# Patient Record
Sex: Male | Born: 1990 | Race: White | Hispanic: No | Marital: Married | Smoking: Former smoker
Health system: Southern US, Community
[De-identification: ages and names within clinical notes are randomized; demographics above are authoritative.]

## PROBLEM LIST (undated history)

## (undated) DIAGNOSIS — B019 Varicella without complication: Secondary | ICD-10-CM

## (undated) HISTORY — DX: Varicella without complication: B01.9

---

## 2013-03-07 ENCOUNTER — Emergency Department: Payer: Self-pay | Admitting: Emergency Medicine

## 2013-08-01 ENCOUNTER — Encounter (HOSPITAL_COMMUNITY): Payer: Self-pay | Admitting: Emergency Medicine

## 2013-08-01 ENCOUNTER — Emergency Department (HOSPITAL_COMMUNITY)
Admission: EM | Admit: 2013-08-01 | Discharge: 2013-08-01 | Disposition: A | Discharge status: Home / Self Care | Payer: Worker's Compensation | Attending: Emergency Medicine | Admitting: Emergency Medicine

## 2013-08-01 ENCOUNTER — Emergency Department (HOSPITAL_COMMUNITY): Payer: Worker's Compensation

## 2013-08-01 DIAGNOSIS — S6710XA Crushing injury of unspecified finger(s), initial encounter: Secondary | ICD-10-CM | POA: Insufficient documentation

## 2013-08-01 DIAGNOSIS — Y9389 Activity, other specified: Secondary | ICD-10-CM | POA: Insufficient documentation

## 2013-08-01 DIAGNOSIS — Y9289 Other specified places as the place of occurrence of the external cause: Secondary | ICD-10-CM | POA: Insufficient documentation

## 2013-08-01 DIAGNOSIS — Z23 Encounter for immunization: Secondary | ICD-10-CM | POA: Insufficient documentation

## 2013-08-01 DIAGNOSIS — IMO0002 Reserved for concepts with insufficient information to code with codable children: Secondary | ICD-10-CM | POA: Insufficient documentation

## 2013-08-01 DIAGNOSIS — Z87891 Personal history of nicotine dependence: Secondary | ICD-10-CM | POA: Insufficient documentation

## 2013-08-01 DIAGNOSIS — S67193A Crushing injury of left middle finger, initial encounter: Secondary | ICD-10-CM

## 2013-08-01 DIAGNOSIS — Y990 Civilian activity done for income or pay: Secondary | ICD-10-CM | POA: Insufficient documentation

## 2013-08-01 DIAGNOSIS — S6000XA Contusion of unspecified finger without damage to nail, initial encounter: Secondary | ICD-10-CM | POA: Insufficient documentation

## 2013-08-01 DIAGNOSIS — S60132A Contusion of left middle finger with damage to nail, initial encounter: Secondary | ICD-10-CM

## 2013-08-01 MED ORDER — IBUPROFEN 800 MG PO TABS: 800.0000 mg | ORAL_TABLET | Freq: Three times a day (TID) | ORAL | Status: DC

## 2013-08-01 MED ORDER — TETANUS-DIPHTH-ACELL PERTUSSIS 5-2.5-18.5 LF-MCG/0.5 IM SUSP
0.5000 mL | Freq: Once | INTRAMUSCULAR | Status: AC
  Administered 2013-08-01: 0.5 mL via INTRAMUSCULAR
  Filled 2013-08-01: qty 0.5

## 2013-08-01 MED ORDER — HYDROCODONE-ACETAMINOPHEN 5-325 MG PO TABS: 1.0000 | ORAL_TABLET | Freq: Four times a day (QID) | ORAL | Status: DC | PRN

## 2013-08-01 NOTE — ED Notes (Signed)
Placed pt finger in water and 3% hydrogen peroxide solution sprayed with dermal wound cleanser. Gave pt ice bag for finger.

## 2013-08-01 NOTE — ED Notes (Signed)
Pt. 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

## 2013-08-01 NOTE — ED Provider Notes (Signed)
CSN: [id number]     Arrival date & time 08/01/13  2028 History  This chart was scribed for non-physician practitioner Jaynie Crumble, PA-C, working with Gerhard Munch, MD by Dorothey Baseman, ED Scribe. This patient was seen in room TR07C/TR07C and the patient's care was started at 8:40 PM.    Chief Complaint  Patient presents with  . Finger Injury   The history is provided by the patient. No language interpreter was used.   HPI Comments: Jack Phillips is a 22 y.o. male who presents to the Emergency Department complaining of an injury to the left middle finger that occurred PTA when he states that he accidentally hit the finger with a sledge hammer while at work. Patient reports a small laceration to the tip of the middle finger. He reports associated pain and swelling to the area. Patient reports that he does not remember when his last tetanus vaccination was. Patient has no other pertinent medical history.   No past medical history on file. No past surgical history on file. No family history on file. History  Substance Use Topics  . Smoking status: Not on file  . Smokeless tobacco: Not on file  . Alcohol Use: Not on file    Review of Systems  Musculoskeletal: Positive for arthralgias, joint swelling and myalgias.  Skin: Positive for wound ( laceration).  All other systems reviewed and are negative.    Allergies  Review of patient's allergies indicates not on file.  Home Medications  No current outpatient prescriptions on file.  Triage Vitals: BP 139/80  Pulse 76  Temp(Src) 98.3 F (36.8 C) (Oral)  Resp 16  SpO2 99%  Physical Exam  Nursing note and vitals reviewed. Constitutional: He is oriented to person, place, and time. He appears well-developed and well-nourished. No distress.  HENT:  Head: Normocephalic and atraumatic.  Eyes: Conjunctivae are normal.  Neck: Normal range of motion. Neck supple.  Pulmonary/Chest: Effort normal. No respiratory distress.   Abdominal: He exhibits no distension.  Musculoskeletal: Normal range of motion.  Neurological: He is alert and oriented to person, place, and time.  Skin: Skin is warm and dry.  Swelling to the distal left middle finger with subungual hematoma. Small laceration, less than 1 cm, to the tip of the finger that is tender to palpation. Cap refill <2 sec. Normal sensation distally  Psychiatric: He has a normal mood and affect. His behavior is normal.    ED Course  Procedures (including critical care time)  DIAGNOSTIC STUDIES: Oxygen Saturation is 99% on room air, normal by my interpretation.    COORDINATION OF CARE: 8:40 PM- Will order an x-ray of the finger. Will order a tetanus vaccination. Discussed treatment plan with patient at bedside and patient verbalized agreement.   9:26 PM- Discussed negative x-ray results. Discussed that the laceration will not need to be repaired with sutures. Advised patient to keep the area clean and elevated and to apply topical antibiotics to prevent infection. Discussed treatment plan with patient at bedside and patient verbalized agreement.    Labs Review Labs Reviewed - No data to display  Imaging Review Dg Finger Middle Left  08/01/2013   CLINICAL DATA:  Left middle finger hit with a sledge hammer.  EXAM: LEFT MIDDLE FINGER 2+V  COMPARISON:  None.  FINDINGS: No fracture. The joints are normally space and aligned. There is a small soft tissue defect over the finger tip. No radiopaque foreign body.  IMPRESSION: No fracture or dislocation or radiopaque foreign body.  Electronically Signed   By: Amie Portland M.D.   On: 08/01/2013 21:20    EKG Interpretation   None       MDM   1. Crushing injury of left middle finger, initial encounter   2. Subungual hematoma of third finger of left hand, initial encounter     Patient with left middle finger injury after hitting it with a sledge hammer. Injuries just to the distal phalanx. Nail is intact however  there is a subungual hematoma for which he himself drilled a hole. There is mild bleeding from the hole. There is a very small less than 1 cm in length laceration to the tip of the finger. The finger is dirty. He cleaned it with a surgical scrub and a sponge here in ED. I did not repair the laceration giving it is a dirty wound. Dressing applied. Instructed to apply Triple Antibiotic ointment. Keep it clean and covered. Followup as needed. Instructed to keep it elevated as well ice it reduced swelling.   I personally performed the services described in this documentation, which was scribed in my presence. The recorded information has been reviewed and is accurate.     Lottie Mussel, PA-C 08/01/13 2350

## 2013-08-02 NOTE — ED Provider Notes (Signed)
  Medical screening examination/treatment/procedure(s) were performed by non-physician practitioner and as supervising physician I was immediately available for consultation/collaboration.  EKG Interpretation   None          Penney Domanski, MD 08/02/13 0000 

## 2014-11-02 IMAGING — CR DG FINGER MIDDLE 2+V*L*
3 series · 3 of 3 positions shown · non-contrast
Comparison: None.

CLINICAL DATA: Left middle finger hit with a sledge hammer.

EXAM:
LEFT MIDDLE FINGER 2+V

[x finger pa left]
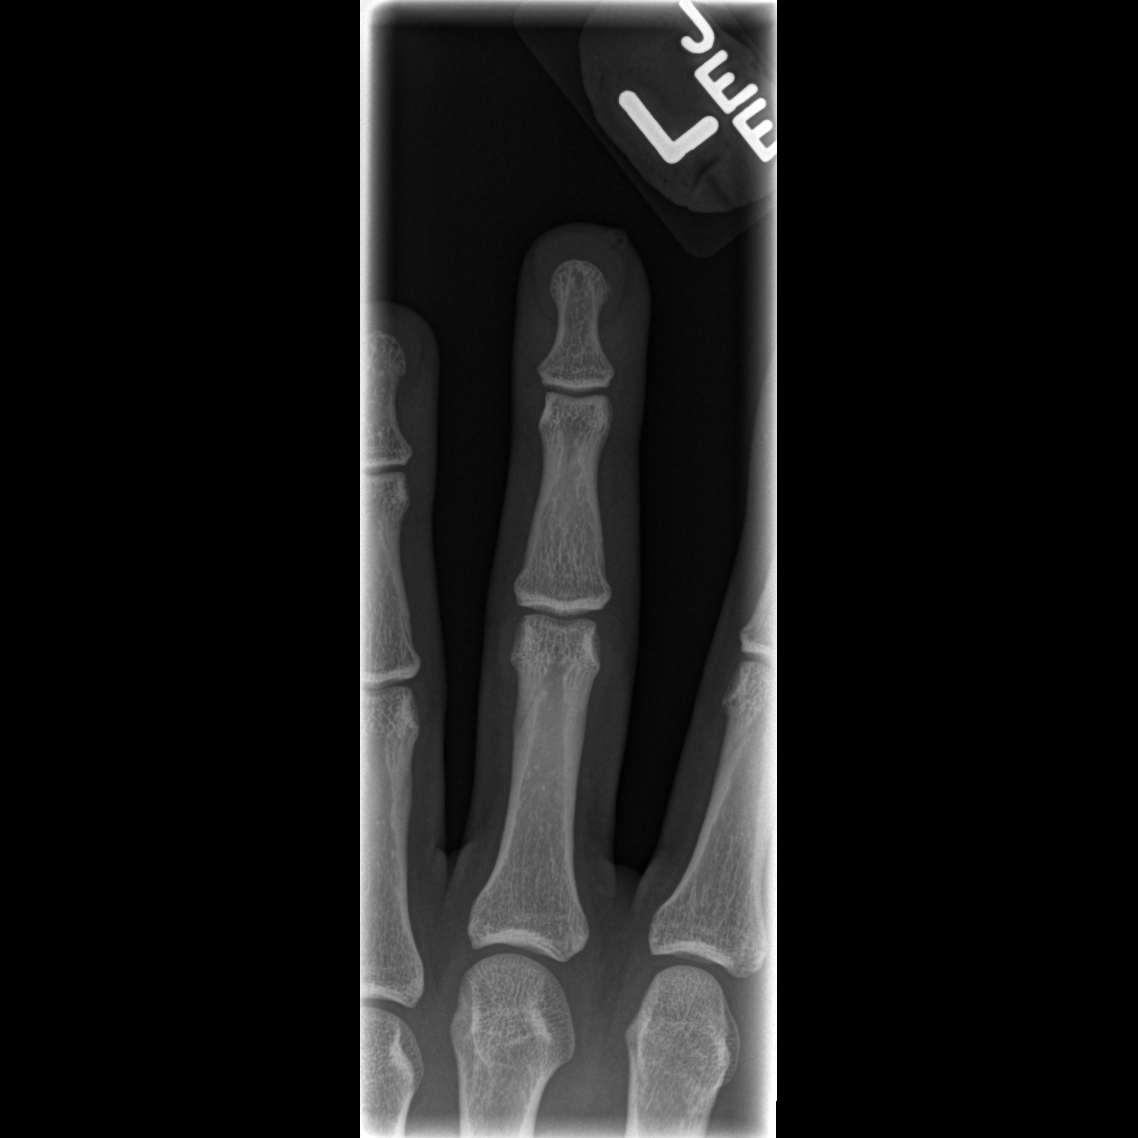

[x finger obl. left]
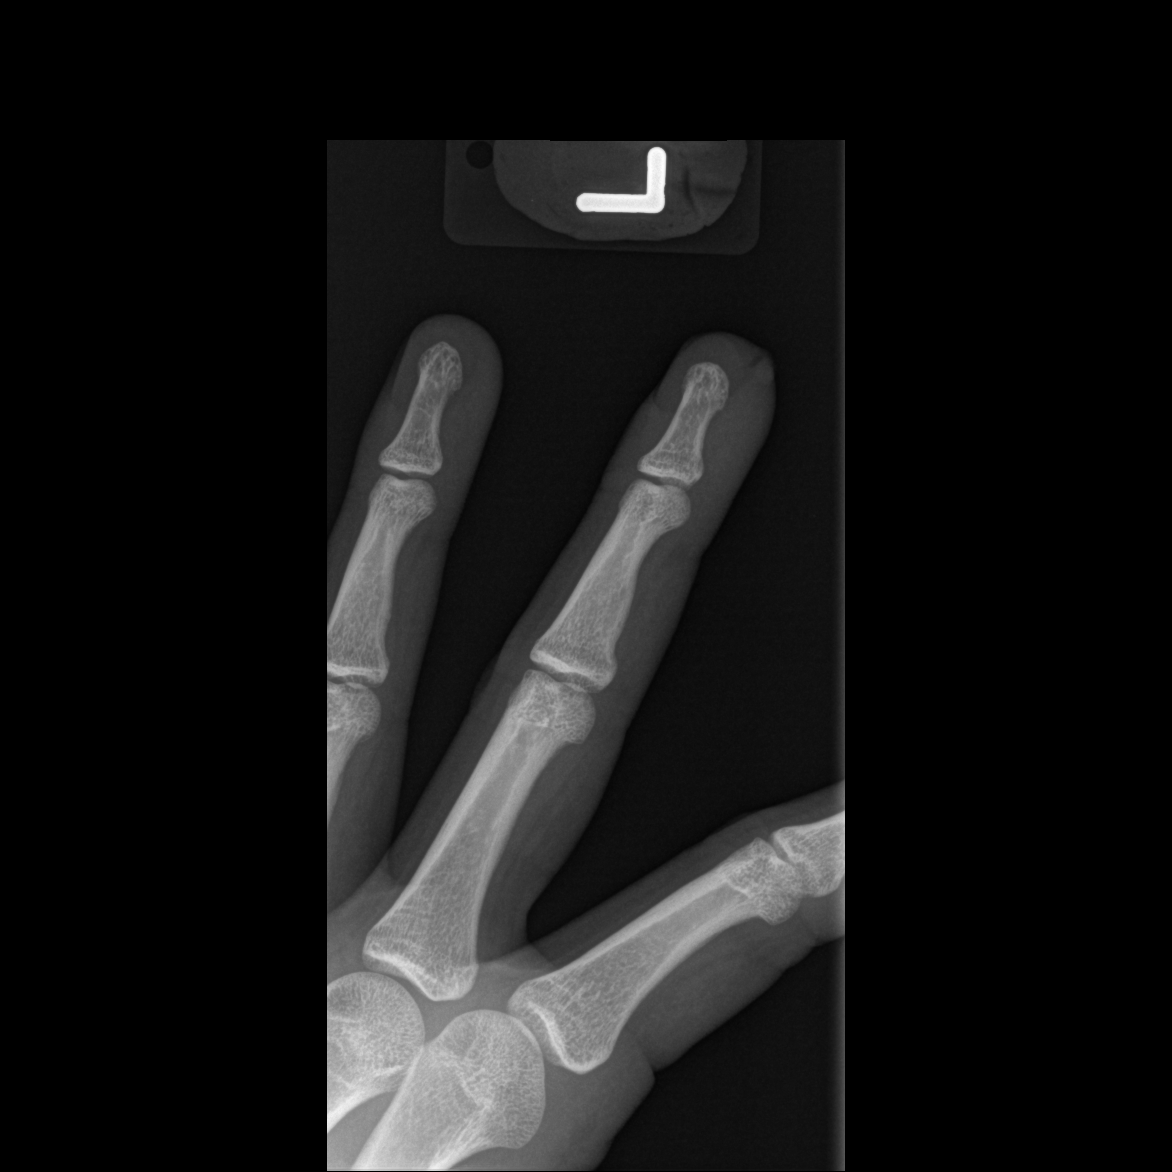

[x finger lateral left]
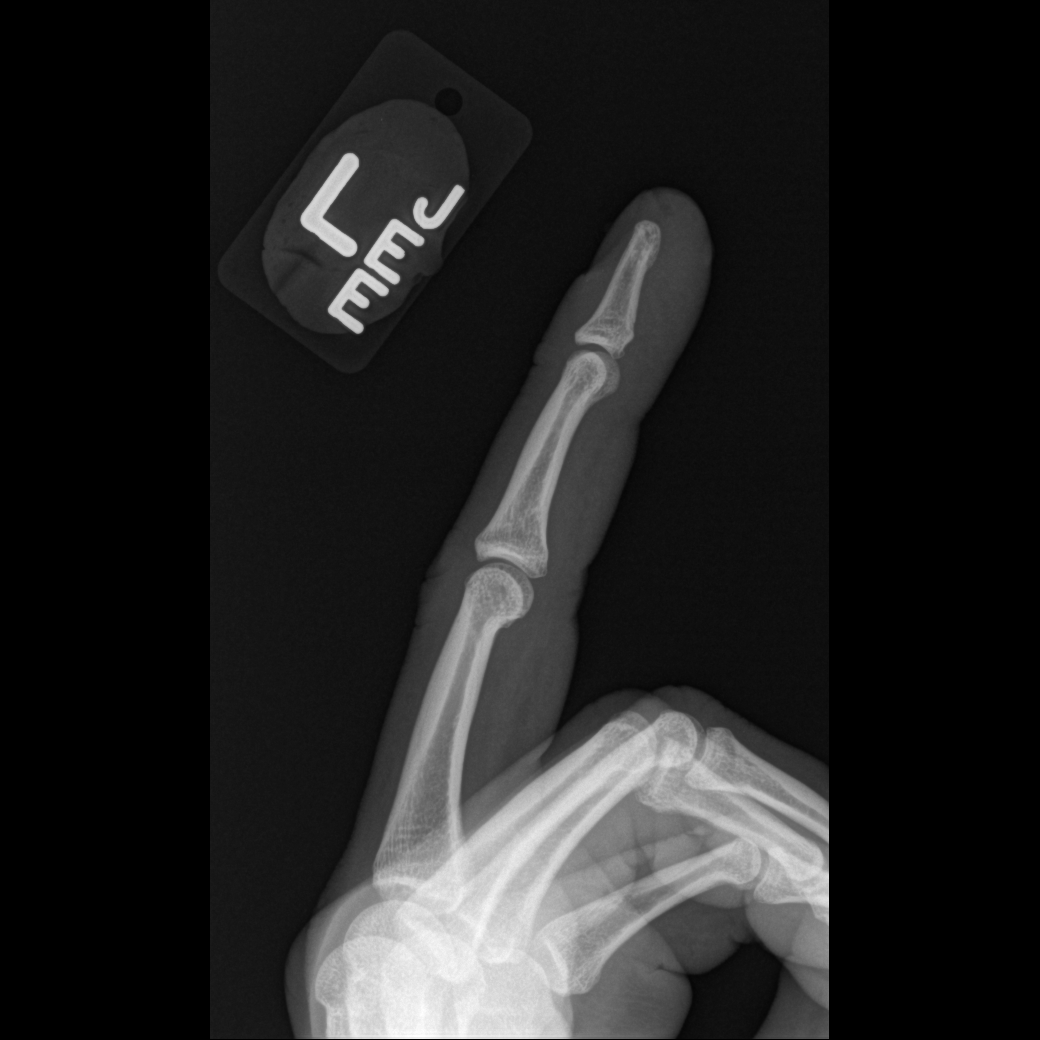

[3 of 3 positions shown; findings below may reference images not displayed]

FINDINGS: No fracture. The joints are normally space and aligned. There is a
small soft tissue defect over the finger tip. No radiopaque foreign
body.
IMPRESSION: No fracture or dislocation or radiopaque foreign body.

## 2015-06-02 ENCOUNTER — Ambulatory Visit (INDEPENDENT_AMBULATORY_CARE_PROVIDER_SITE_OTHER): Payer: 59 | Admitting: Family Medicine

## 2015-06-02 VITALS — BP 114/68 | HR 71 | Temp 98.0°F | Ht 71.0 in | Wt 193.4 lb

## 2015-06-02 DIAGNOSIS — M5432 Sciatica, left side: Secondary | ICD-10-CM | POA: Diagnosis not present

## 2015-06-02 DIAGNOSIS — G8929 Other chronic pain: Secondary | ICD-10-CM | POA: Insufficient documentation

## 2015-06-02 DIAGNOSIS — R05 Cough: Secondary | ICD-10-CM

## 2015-06-02 DIAGNOSIS — M545 Low back pain, unspecified: Secondary | ICD-10-CM | POA: Insufficient documentation

## 2015-06-02 DIAGNOSIS — R059 Cough, unspecified: Secondary | ICD-10-CM

## 2015-06-02 MED ORDER — PREDNISONE 10 MG PO TABS: ORAL_TABLET | ORAL | Status: DC

## 2015-06-02 NOTE — Progress Notes (Signed)
Patient ID: Jack Phillips, male   DOB: [birth year], 24 y.o.   MRN: [id number]  Marikay Alar, MD Phone: [phone]  Jack Phillips is a 24 y.o. male who presents today for new patient visit.  Back pain: started 4 weeks ago. No particular injury noted. Location above his buttocks and radiated down right leg at that time for 1-2 days. Radiation stopped, though low back pain has persisted. Then yesterday noted radiation down his left leg. Has gradually gotten worse. No numbness, weakness, saddle anesthesia, incontinence, fevers, or history of cancer. Went to [facility] ED yesterday and had lumbar spine film that had no fracture or acute abnormalities. Was given single dose PO prednisone and RX for flexeril. Only injury to back was when he was 23 yo and he fell and landed on his low back. Notes he was advised that he had cartilage issues in his back at that time. No issues since that time.   Cough: 2 days of mildly productive cough of yellow sputum. No URI symptoms. Mild sour taste in back of throat. No burning. No shortness of breath. Has history of GERD. Smokes 1 ppd. No sick contacts. Feels well.  Active Ambulatory Problems    Diagnosis Date Noted  . Sciatica of left side 06/02/2015  . Cough 06/02/2015   Resolved Ambulatory Problems    Diagnosis Date Noted  . No Resolved Ambulatory Problems   Past Medical History  Diagnosis Date  . Chicken pox     Family History  Problem Relation Age of Onset  . Hypertension Mother   . Hypertension Father   . Diabetes Mellitus I Brother     Social History   Social History  . Marital Status: Married    Spouse Name: N/A  . Number of Children: N/A  . Years of Education: N/A   Occupational History  . Not on file.   Social History Main Topics  . Smoking status: Smoker, Current Status Unknown -- 1.00 packs/day    Types: Cigarettes  . Smokeless tobacco: Not on file  . Alcohol Use: 0.0 oz/week    0 Standard drinks or equivalent per week      Comment: occasional  . Drug Use: No  . Sexual Activity: Not on file   Other Topics Concern  . Not on file   Social History Narrative    ROS   General:  Negative for unexplained weight loss, fever Skin: Negative for new or changing mole, sore that won't heal HEENT: Negative for trouble hearing, trouble seeing, ringing in ears, mouth sores, hoarseness, change in voice, dysphagia. CV:  Negative for chest pain, dyspnea, edema, palpitations Resp: positive for cough, Negative for dyspnea, hemoptysis GI: Negative for nausea, vomiting, diarrhea, constipation, abdominal pain, melena, hematochezia. GU: Negative for dysuria, incontinence, urinary hesitance, hematuria, vaginal or penile discharge, polyuria, sexual difficulty, lumps in testicle or breasts MSK: positive for back pain, Negative for muscle cramps or aches, joint pain or swelling Neuro: Negative for headaches, weakness, numbness, dizziness, passing out/fainting Psych: Negative for depression, anxiety, memory problems  Objective  Physical Exam Filed Vitals:   06/02/15 0904  BP: 114/68  Pulse: 71  Temp: 98 F (36.7 C)    Physical Exam  Constitutional: He is well-developed, well-nourished, and in no distress.  HENT:  Head: Normocephalic and atraumatic.  Right Ear: External ear normal.  Left Ear: External ear normal.  Mouth/Throat: Oropharynx is clear and moist.  Normal TMs bilaterally  Eyes: Conjunctivae are normal. Pupils are equal, round, and reactive  to light.  Neck: Neck supple.  Cardiovascular: Normal rate, regular rhythm and normal heart sounds.  Exam reveals no gallop and no friction rub.   No murmur heard. Pulmonary/Chest: Effort normal and breath sounds normal. No respiratory distress. He has no wheezes. He has no rales.  Abdominal: Soft. Bowel sounds are normal. He exhibits no distension. There is no tenderness. There is no rebound and no guarding.  Musculoskeletal:  No midline spine tenderness, mild left  low back muscular tenderness, no swelling, positive straight leg raise on left  Lymphadenopathy:    He has no cervical adenopathy.  Neurological: He is alert.  5/5 strength in bilateral quads, hamstrings, plantar and dorsiflexion, sensation to light touch intact in bilateral LE, normal gait, 2+ patellar reflexes  Skin: Skin is warm and dry. He is not diaphoretic.  Psychiatric: Mood and affect normal.     Assessment/Plan:   Sciatica of left side Left low back pain with sciatica. Positive left straight leg raise. Neuro intact. No red flags. Will treat with prednisone taper. Continue prn flexeril. Refer to ortho for further eval. Given return precautions.   Cough 2 day history of cough. Only other symptom is sour taste. Could be related to GERD. Could be related to smoking. Could be the start of viral illness. Patient is well appearing. Normal pulmonary exam. Normal O2 sat. Unlikely PNA with these aspects being normal. Will start on OTC prilosec. Will continue to monitor. Given return precautions.     Orders Placed This Encounter  Procedures  . Ambulatory referral to Orthopedic Surgery    Referral Priority:  Routine    Referral Type:  Surgical    Referral Reason:  Specialty Services Required    Requested Specialty:  Orthopedic Surgery    Number of Visits Requested:  1    Meds ordered this encounter  Medications  . cyclobenzaprine (FLEXERIL) 10 MG tablet    Sig: Take by mouth.  . predniSONE (DELTASONE) 10 MG tablet    Sig: Please take 50 mg (5 tablets) by mouth today, then take 40 mg (4 tablets) by mouth on day 2, then take 30 mg (3 tablets) by mouth on day 3, then take 20 mg (2 tablets) by mouth on day 4, then take 10 mg (1 tablet) by mouth on day 5    Dispense:  20 tablet    Refill:  0    Marikay Alar

## 2015-06-02 NOTE — Assessment & Plan Note (Signed)
Left low back pain with sciatica. Positive left straight leg raise. Neuro intact. No red flags. Will treat with prednisone taper. Continue prn flexeril. Refer to ortho for further eval. Given return precautions.

## 2015-06-02 NOTE — Assessment & Plan Note (Addendum)
2 day history of cough. Only other symptom is sour taste. Could be related to GERD. Could be related to smoking. Could be the start of viral illness. Patient is well appearing. Normal pulmonary exam. Normal O2 sat. Unlikely PNA with these aspects being normal. Will start on OTC prilosec. Will continue to monitor. Given return precautions.

## 2015-06-02 NOTE — Patient Instructions (Signed)
Nice to meet you. You have back pain with sciatica.  We will treat this with prednisone and referral to orthopedics for further evaluation. You should apply heat to the area as well. Continue with your normal activities as you are able. If you develop worsening pain, numbness, weakness, loss of bowel or bladder function, fever, numbness between your legs, or do not improve please seek medical attention.

## 2015-06-02 NOTE — Progress Notes (Signed)
Pre visit review using our clinic review tool, if applicable. No additional management support is needed unless otherwise documented below in the visit note. 

## 2015-06-03 ENCOUNTER — Encounter: Payer: Self-pay | Admitting: Family Medicine

## 2015-06-03 ENCOUNTER — Ambulatory Visit: Payer: Self-pay | Admitting: Family Medicine

## 2015-07-03 ENCOUNTER — Ambulatory Visit (INDEPENDENT_AMBULATORY_CARE_PROVIDER_SITE_OTHER): Payer: 59 | Admitting: Family Medicine

## 2015-07-03 ENCOUNTER — Encounter: Payer: Self-pay | Admitting: Family Medicine

## 2015-07-03 VITALS — BP 106/74 | HR 77 | Temp 97.7°F | Ht 71.0 in | Wt 195.8 lb

## 2015-07-03 DIAGNOSIS — E663 Overweight: Secondary | ICD-10-CM | POA: Diagnosis not present

## 2015-07-03 DIAGNOSIS — Z23 Encounter for immunization: Secondary | ICD-10-CM

## 2015-07-03 DIAGNOSIS — Z0000 Encounter for general adult medical examination without abnormal findings: Secondary | ICD-10-CM | POA: Diagnosis not present

## 2015-07-03 DIAGNOSIS — Z0001 Encounter for general adult medical examination with abnormal findings: Secondary | ICD-10-CM | POA: Insufficient documentation

## 2015-07-03 DIAGNOSIS — Z114 Encounter for screening for human immunodeficiency virus [HIV]: Secondary | ICD-10-CM

## 2015-07-03 LAB — LIPID PANEL
CHOL/HDL RATIO: 5
Cholesterol: 165 mg/dL (ref 0–200)
HDL: 34.6 mg/dL — ABNORMAL LOW (ref >39.00)
LDL CALC: 109 mg/dL — AB (ref 0–99)
NONHDL: 130.24
TRIGLYCERIDES: 105 mg/dL (ref 0.0–149.0)
VLDL: 21 mg/dL (ref 0.0–40.0)

## 2015-07-03 LAB — COMPREHENSIVE METABOLIC PANEL
ALT: 25 U/L (ref 0–53)
AST: 20 U/L (ref 0–37)
Albumin: 4.7 g/dL (ref 3.5–5.2)
Alkaline Phosphatase: 63 U/L (ref 39–117)
BUN: 12 mg/dL (ref 6–23)
CALCIUM: 9.5 mg/dL (ref 8.4–10.5)
CHLORIDE: 105 meq/L (ref 96–112)
CO2: 28 mEq/L (ref 19–32)
Creatinine, Ser: 1.14 mg/dL (ref 0.40–1.50)
GFR: 84.15 mL/min (ref >60.00)
GLUCOSE: 100 mg/dL — AB (ref 70–99)
Potassium: 4.1 mEq/L (ref 3.5–5.1)
Sodium: 141 mEq/L (ref 135–145)
Total Bilirubin: 0.4 mg/dL (ref 0.2–1.2)
Total Protein: 6.9 g/dL (ref 6.0–8.3)

## 2015-07-03 NOTE — Progress Notes (Signed)
Patient ID: Jack Phillips, male   DOB: 11/06/1990, 24 y.o.   MRN: [id number]  Tommi Rumps, MD Phone: [phone]  Jack Phillips is a 24 y.o. male who presents today for yearly physical.  Patient reports no complaints today. He is here for a yearly physical. Patient does not exercise. He does not monitor what he eats. He typically eats 2 meals a day. We will have prostatitis salad or a burger for lunch. Similar things for dinner. Does eat vegetables with each meal. Drinks 2 sodas or CTs a day. 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 He does smoke half a pack per day and is trying to quit. He is using electric cigarette to quit. He notes this is helped in the past. He has not tried patches, lozenges, or nicotine gum. He wants to stick with the electric cigarette at this time. He drinks alcohol only on the weekends. This is 2-3 beers. He does not use any drugs.  He has not had any HIV testing done in the past. He does not family history of hypertension, hyperlipidemia, and diabetes. Has not had a flu shot this year. Does report in the past he has gotten the flu after getting the flu shot, though has not had any anaphylactic reaction or rash to the flu shot. Has no egg allergy.  Active Ambulatory Problems    Diagnosis Date Noted  . Sciatica of left side 06/02/2015  . Cough 06/02/2015  . Annual physical exam 07/03/2015   Resolved Ambulatory Problems    Diagnosis Date Noted  . No Resolved Ambulatory Problems   Past Medical History  Diagnosis Date  . Chicken pox     Family History  Problem Relation Age of Onset  . Hypertension Mother   . Hypertension Father   . Diabetes Mellitus I Brother     Social History   Social History  . Marital Status: Married    Spouse Name: N/A  . Number of Children: N/A  . Years of Education: N/A   Occupational History  . Not on file.   Social History Main Topics  . Smoking status: Current Every Day  Smoker -- 1.00 packs/day    Types: Cigarettes  . Smokeless tobacco: Not on file  . Alcohol Use: 1.8 oz/week    3 Standard drinks or equivalent per week     Comment: occasional  . Drug Use: No  . Sexual Activity: Not on file   Other Topics Concern  . Not on file   Social History Narrative    ROS   General:  Negative for unexplained weight loss, fever Skin: Negative for new or changing mole, sore that won't heal HEENT: Negative for trouble hearing, trouble seeing, ringing in ears, mouth sores, hoarseness, change in voice, dysphagia. CV:  Negative for chest pain, dyspnea, edema, palpitations Resp: Negative for cough, dyspnea, hemoptysis GI: Negative for nausea, vomiting, diarrhea, constipation, abdominal pain, melena, hematochezia. GU: Negative for dysuria, incontinence, urinary hesitance, hematuria, vaginal or penile discharge, polyuria, sexual difficulty, lumps in testicle or breasts MSK: Negative for muscle cramps or aches, joint pain or swelling Neuro: Negative for headaches, weakness, numbness, dizziness, passing out/fainting Psych: Negative for depression, anxiety, memory problems  Objective  Physical Exam Filed Vitals:   07/03/15 0829  BP: 106/74  Pulse: 77  Temp: 97.7 F (36.5 C)    Physical Exam  Constitutional: He is well-developed, well-nourished, and in no distress.  HENT:  Head: Normocephalic and  atraumatic.  Right Ear: External ear normal.  Left Ear: External ear normal.  Mouth/Throat: Oropharynx is clear and moist. No oropharyngeal exudate.  Eyes: Conjunctivae are normal. Pupils are equal, round, and reactive to light.  Neck: Neck supple.  Cardiovascular: Normal rate, regular rhythm and normal heart sounds.  Exam reveals no gallop and no friction rub.   No murmur heard. Pulmonary/Chest: Effort normal and breath sounds normal. No respiratory distress. He has no wheezes. He has no rales.  Abdominal: Soft. He exhibits no distension. There is no tenderness.  There is no rebound and no guarding.  Musculoskeletal: He exhibits no edema.  Lymphadenopathy:    He has no cervical adenopathy.  Neurological: He is alert. Gait normal.  Skin: Skin is warm and dry. He is not diaphoretic.  Psychiatric: Mood and affect normal.     Assessment/Plan:   Annual physical exam Patient presents for annual physical exam. He is doing well overall. He is overweight. We discussed this. We discussed diet and exercise. He is given information on diet. He is a smoker. He is trying to quit currently using electric cigarette. We did discuss this. I offered additional assistance, though the patient opted to stick with the electric cigarette. We will check HIV test today. We will check a C met and lipid panel as well given he is overweight and family history of hypertension, hyperlipidemia, and diabetes. He was given a flu shot today.    Orders Placed This Encounter  Procedures  . Flu Vaccine QUAD 36+ mos IM  . Comp Met (CMET)  . HIV antibody (with reflex)  . Lipid Profile   Tommi Rumps

## 2015-07-03 NOTE — Assessment & Plan Note (Addendum)
Patient presents for annual physical exam. He is doing well overall. He is overweight. We discussed this. We discussed diet and exercise. He is given information on diet. He is a smoker. He is trying to quit currently using electric cigarette. We did discuss this. I offered additional assistance, though the patient opted to stick with the electric cigarette. We will check HIV test today. We will check a C met and lipid panel as well given he is overweight and family history of hypertension, hyperlipidemia, and diabetes. He was given a flu shot today.

## 2015-07-03 NOTE — Patient Instructions (Signed)
Nice to see you. We will obtain lab work.  Please work on exercising 3 days a week for 20-30 minutes. Please eat breakfast and look at the dietary instructions below.   Diet Recommendations  Starchy (carb) foods: Bread, rice, pasta, potatoes, corn, cereal, grits, crackers, bagels, muffins, all baked goods.  (Fruits, milk, and yogurt also have carbohydrate, but most of these foods will not spike your blood sugar as the starchy foods will.)  A few fruits do cause high blood sugars; use small portions of bananas (limit to 1/2 at a time), grapes, watermelon, oranges, and most tropical fruits.    Protein foods: Meat, fish, poultry, eggs, dairy foods, and beans such as pinto and kidney beans (beans also provide carbohydrate).   1. Eat at least 3 meals and 1-2 snacks per day. Never go more than 4-5 hours while awake without eating. Eat breakfast within the first hour of getting up.   2. Limit starchy foods to TWO per meal and ONE per snack. ONE portion of a starchy  food is equal to the following:   - ONE slice of bread (or its equivalent, such as half of a hamburger bun).   - 1/2 cup of a "scoopable" starchy food such as potatoes or rice.   - 15 grams of carbohydrate as shown on food label.  3. Include at every meal: a protein food, a carb food, and vegetables and/or fruit.   - Obtain twice the volume of veg's as protein or carbohydrate foods for both lunch and dinner.   - Fresh or frozen veg's are best.   - Keep frozen veg's on hand for a quick vegetable serving.

## 2015-07-03 NOTE — Progress Notes (Signed)
Pre visit review using our clinic review tool, if applicable. No additional management support is needed unless otherwise documented below in the visit note. 

## 2015-07-04 LAB — HIV ANTIBODY (ROUTINE TESTING W REFLEX): HIV 1&2 Ab, 4th Generation: NONREACTIVE

## 2015-07-06 ENCOUNTER — Other Ambulatory Visit: Payer: Self-pay | Admitting: Family Medicine

## 2015-07-06 DIAGNOSIS — R739 Hyperglycemia, unspecified: Secondary | ICD-10-CM

## 2015-07-06 DIAGNOSIS — R7309 Other abnormal glucose: Secondary | ICD-10-CM

## 2015-07-13 ENCOUNTER — Other Ambulatory Visit (INDEPENDENT_AMBULATORY_CARE_PROVIDER_SITE_OTHER): Payer: 59

## 2015-07-13 DIAGNOSIS — R7309 Other abnormal glucose: Secondary | ICD-10-CM | POA: Diagnosis not present

## 2015-07-13 LAB — HEMOGLOBIN A1C: HEMOGLOBIN A1C: 5.1 % (ref 4.6–6.5)

## 2015-12-17 ENCOUNTER — Encounter: Payer: Self-pay | Admitting: Family Medicine

## 2015-12-17 ENCOUNTER — Ambulatory Visit (INDEPENDENT_AMBULATORY_CARE_PROVIDER_SITE_OTHER): Payer: Managed Care, Other (non HMO) | Admitting: Family Medicine

## 2015-12-17 VITALS — BP 108/68 | HR 77 | Temp 98.1°F | Ht 71.0 in | Wt 190.0 lb

## 2015-12-17 DIAGNOSIS — J029 Acute pharyngitis, unspecified: Secondary | ICD-10-CM | POA: Diagnosis not present

## 2015-12-17 LAB — POCT RAPID STREP A (OFFICE): Rapid Strep A Screen: NEGATIVE

## 2015-12-17 MED ORDER — GUAIFENESIN ER 600 MG PO TB12: 600.0000 mg | ORAL_TABLET | Freq: Two times a day (BID) | ORAL | Status: DC

## 2015-12-17 MED ORDER — FLUTICASONE PROPIONATE 50 MCG/ACT NA SUSP: 2.0000 | Freq: Every day | NASAL | Status: DC

## 2015-12-17 MED ORDER — LORATADINE 10 MG PO TABS: 10.0000 mg | ORAL_TABLET | Freq: Every day | ORAL | Status: DC

## 2015-12-17 NOTE — Patient Instructions (Signed)
Nice to see you. Your symptoms are likely related to a viral illness. You can take over-the-counter Flonase, Claritin, Mucinex, and pseudoephedrine to help with her symptoms. You should stay well hydrated. You can take ibuprofen or Tylenol for any discomfort. If you develop cough productive of blood, shortness of breath, persistent fevers, or any new or changing symptoms please seek medical attention.

## 2015-12-17 NOTE — Progress Notes (Signed)
Patient ID: Jack Phillips, male   DOB: Dec 19, 1990, 25 y.o.   MRN: [id number]  Jack AlarEric Jaime Dome, MD Phone: [phone]  Jack DurhamJoshua Phillip Chilton Phillips is a 25 y.o. male who presents today for same-day visit.  Patient reports onset of symptoms last night. Started with sore throat and has some nasal congestion. No postnasal drip. No cough. No body aches. No ear discomfort. Does note he has blown some thick mucus out of his nose. He had a temperature of 100.90F this morning. No known sick contacts. He did take some ibuprofen with minimal benefit.  PMH: Smoker   ROS see history of present illness  Objective  Physical Exam Filed Vitals:   12/17/15 0927  BP: 108/68  Pulse: 77  Temp: 98.1 F (36.7 C)    BP Readings from Last 3 Encounters:  12/17/15 108/68  07/03/15 106/74  06/02/15 114/68   Wt Readings from Last 3 Encounters:  12/17/15 190 lb (86.183 kg)  07/03/15 195 lb 12.8 oz (88.814 kg)  06/02/15 193 lb 6.4 oz (87.726 kg)    Physical Exam  Constitutional: He is well-developed, well-nourished, and in no distress.  HENT:  Head: Normocephalic and atraumatic.  Right Ear: External ear normal.  Left Ear: External ear normal.  TMs normal bilaterally, mild posterior oropharyngeal erythema with no tonsillar swelling or exudate  Eyes: Conjunctivae are normal. Pupils are equal, round, and reactive to light.  Neck: Neck supple.  Cardiovascular: Normal rate, regular rhythm and normal heart sounds.   Pulmonary/Chest: Effort normal and breath sounds normal.  Lymphadenopathy:    He has cervical adenopathy (minimal swollen anterior lymph nodes, nontender).  Neurological: He is alert. Gait normal.  Skin: Skin is warm and dry. He is not diaphoretic.     Assessment/Plan: Please see individual problem list.  Sore throat Patient's symptoms most consistent with viral upper respiratory infection. Rapid strep was negative. Doubt bacterial infection with short duration of symptoms. Benign lung  exam. Nothing on exam to indicate bacterial infection. We'll treat supportively with Flonase, Claritin, and Mucinex. Advised that he could try pseudoephedrine for his congestion if these other medicines do not help. Tylenol and ibuprofen for discomfort. Discussed hygiene with him having a child at home. Given return precautions.    Orders Placed This Encounter  Procedures  . POCT rapid strep A    Meds ordered this encounter  Medications  . loratadine (CLARITIN) 10 MG tablet    Sig: Take 1 tablet (10 mg total) by mouth daily.    Dispense:  30 tablet    Refill:  0  . guaiFENesin (MUCINEX) 600 MG 12 hr tablet    Sig: Take 1 tablet (600 mg total) by mouth 2 (two) times daily.    Dispense:  30 tablet    Refill:  0  . fluticasone (FLONASE) 50 MCG/ACT nasal spray    Sig: Place 2 sprays into both nostrils daily.    Dispense:  16 g    Refill:  0   Jack AlarEric Nikoletta Varma, MD [facility]

## 2015-12-17 NOTE — Progress Notes (Signed)
Pre visit review using our clinic review tool, if applicable. No additional management support is needed unless otherwise documented below in the visit note. 

## 2015-12-17 NOTE — Assessment & Plan Note (Signed)
Patient's symptoms most consistent with viral upper respiratory infection. Rapid strep was negative. Doubt bacterial infection with short duration of symptoms. Benign lung exam. Nothing on exam to indicate bacterial infection. We'll treat supportively with Flonase, Claritin, and Mucinex. Advised that he could try pseudoephedrine for his congestion if these other medicines do not help. Tylenol and ibuprofen for discomfort. Discussed hygiene with him having a child at home. Given return precautions.

## 2016-07-04 ENCOUNTER — Ambulatory Visit
Admission: RE | Admit: 2016-07-04 | Discharge: 2016-07-04 | Disposition: A | Discharge status: Home / Self Care | Payer: Managed Care, Other (non HMO) | Source: Ambulatory Visit | Attending: Family Medicine | Admitting: Family Medicine

## 2016-07-04 ENCOUNTER — Ambulatory Visit (INDEPENDENT_AMBULATORY_CARE_PROVIDER_SITE_OTHER): Payer: Managed Care, Other (non HMO) | Admitting: Family Medicine

## 2016-07-04 ENCOUNTER — Telehealth: Payer: Self-pay | Admitting: Family Medicine

## 2016-07-04 ENCOUNTER — Encounter: Payer: Self-pay | Admitting: Family Medicine

## 2016-07-04 VITALS — BP 114/64 | HR 84 | Temp 98.2°F | Resp 16 | Ht 71.0 in | Wt 188.1 lb

## 2016-07-04 DIAGNOSIS — Z0001 Encounter for general adult medical examination with abnormal findings: Secondary | ICD-10-CM

## 2016-07-04 DIAGNOSIS — M79605 Pain in left leg: Secondary | ICD-10-CM | POA: Insufficient documentation

## 2016-07-04 DIAGNOSIS — Z23 Encounter for immunization: Secondary | ICD-10-CM | POA: Diagnosis not present

## 2016-07-04 DIAGNOSIS — E663 Overweight: Secondary | ICD-10-CM | POA: Diagnosis not present

## 2016-07-04 LAB — COMPREHENSIVE METABOLIC PANEL
ALT: 31 U/L (ref 0–53)
AST: 22 U/L (ref 0–37)
Albumin: 4.8 g/dL (ref 3.5–5.2)
Alkaline Phosphatase: 63 U/L (ref 39–117)
BUN: 15 mg/dL (ref 6–23)
CALCIUM: 9.9 mg/dL (ref 8.4–10.5)
CHLORIDE: 105 meq/L (ref 96–112)
CO2: 30 meq/L (ref 19–32)
CREATININE: 1.19 mg/dL (ref 0.40–1.50)
GFR: 79.41 mL/min (ref >60.00)
Glucose, Bld: 93 mg/dL (ref 70–99)
POTASSIUM: 4.2 meq/L (ref 3.5–5.1)
Sodium: 141 mEq/L (ref 135–145)
Total Bilirubin: 0.9 mg/dL (ref 0.2–1.2)
Total Protein: 7.1 g/dL (ref 6.0–8.3)

## 2016-07-04 LAB — HEMOGLOBIN A1C: Hgb A1c MFr Bld: 5.2 % (ref 4.6–6.5)

## 2016-07-04 NOTE — Progress Notes (Signed)
Jack Rumps, MD Phone: [phone]  Jack Phillips is a 25 y.o. male who presents today for physical exam.  Diet is described as eating anything and everything. Does drink a lot of soda and sweet tea. 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 He is also going to start exercising by walking. Up-to-date on tetanus vaccination. Up-to-date on HIV testing. Quit smoking about a week ago. 4-6 alcoholic beverages a week. No illicit drug use. No family history of cancer. Sees a dentist. No ophthalmology as he has no vision issues.  Notes over the last 4-5 months he's had some knots on his posterior calf. He notes he had never noted the ones medially until today. Notes about a month ago he had some pain in his calf with this. States he can only feel it when he is stepping. He notes no swelling. No history of blood clot. No recent trips or surgeries. He notes these in his left calf.  Active Ambulatory Problems    Diagnosis Date Noted  . Sciatica of left side 06/02/2015  . Cough 06/02/2015  . Encounter for general adult medical examination with abnormal findings 07/03/2015  . Sore throat 12/17/2015  . Left leg pain 07/04/2016   Resolved Ambulatory Problems    Diagnosis Date Noted  . No Resolved Ambulatory Problems   Past Medical History:  Diagnosis Date  . Chicken pox     Family History  Problem Relation Age of Onset  . Hypertension Mother   . Hypertension Father   . Diabetes Mellitus I Brother     Social History   Social History  . Marital status: Married    Spouse name: N/A  . Number of children: N/A  . Years of education: N/A   Occupational History  . Not on file.   Social History Main Topics  . Smoking status: Former Smoker    Packs/day: 1.00    Types: Cigarettes  . Smokeless tobacco: Former Systems developer  . Alcohol use 1.8 oz/week    3 Standard drinks or equivalent per week     Comment: occasional  . Drug use: No  . Sexual activity: Not on file    Other Topics Concern  . Not on file   Social History Narrative  . No narrative on file    ROS  General:  Negative for nexplained weight loss, fever Skin: Negative for new or changing mole, sore that won't heal HEENT: Negative for trouble hearing, trouble seeing, ringing in ears, mouth sores, hoarseness, change in voice, dysphagia. CV:  Negative for chest pain, dyspnea, edema, palpitations Resp: Negative for cough, dyspnea, hemoptysis GI: Negative for nausea, vomiting, diarrhea, constipation, abdominal pain, melena, hematochezia. GU: Negative for dysuria, incontinence, urinary hesitance, hematuria, vaginal or penile discharge, polyuria, sexual difficulty, lumps in testicle or breasts MSK: Negative for muscle cramps or aches, joint pain or swelling Neuro: Negative for headaches, weakness, numbness, dizziness, passing out/fainting Psych: Negative for depression, anxiety, memory problems  Objective  Physical Exam Vitals:   07/04/16 0859  BP: 114/64  Pulse: 84  Resp: 16  Temp: 98.2 F (36.8 C)    BP Readings from Last 3 Encounters:  07/04/16 114/64  12/17/15 108/68  07/03/15 106/74   Wt Readings from Last 3 Encounters:  07/04/16 188 lb 2 oz (85.3 kg)  12/17/15 190 lb (86.2 kg)  07/03/15 195 lb 12.8 oz (88.8 kg)    Physical Exam  Constitutional: He is well-developed, well-nourished, and in no distress.  HENT:  Head: Normocephalic and  atraumatic.  Mouth/Throat: Oropharynx is clear and moist. No oropharyngeal exudate.  Eyes: Conjunctivae are normal. Pupils are equal, round, and reactive to light.  Cardiovascular: Normal rate, regular rhythm and normal heart sounds.   Pulmonary/Chest: Effort normal and breath sounds normal.  Abdominal: Soft. Bowel sounds are normal. He exhibits no distension. There is no tenderness. There is no rebound and no guarding.  Musculoskeletal:  Left medial and posterior calf with apparent varicose veins that are nontender, there are no cords  palpated, left calf 36 cm, right calf 35 cm, no tenderness to the bilateral calves, no varicose veins or cords palpated in the right calf  Neurological: He is alert. Gait normal.  Skin: Skin is warm and dry.  Psychiatric: Mood and affect normal.     Assessment/Plan:   Encounter for general adult medical examination with abnormal findings Overall doing well. Is overweight. Discussed diet and exercise. He will decrease soda and sweet tea intake once he is comfortable with his smoking cessation. Congratulated on smoking cessation. Flu shot given today. Up-to-date on tetanus vaccination and HIV testing. Lab work as outlined below.  Left leg pain Patient with pain in his left calf about a month ago. It is associated varicose veins in this area. Apparently new varicose veins in medial calf. Patient is young and doesn't have any risk factors for DVT though this is the one thing that I would be hesitant to miss. We will obtain an ultrasound of his left lower extremity to evaluate for DVT. Advised if he has a DVT we would discuss anticoagulation. If no DVT he can use warm compresses and ibuprofen if there is discomfort. Given return precautions.   Orders Placed This Encounter  Procedures  . US Venous Img Lower Unilateral Left    SPOKE W/Melissa    Standing Status:   Future    Standing Expiration Date:   09/03/2017    Order Specific Question:   Reason for Exam (SYMPTOM  OR DIAGNOSIS REQUIRED)    Answer:   left lower extremity pain with new varicose veins    Order Specific Question:   Preferred imaging location?    Answer:   [facility]    Order Specific Question:   Call Results- Best Contact Number?    Answer:   [phone] HOLD PATIENT WITH RESULTS  . Flu Vaccine QUAD 36+ mos IM  . HgB A1c  . Comp Met (CMET)    No orders of the defined types were placed in this encounter.    Jack Rumps, MD [facility]

## 2016-07-04 NOTE — Assessment & Plan Note (Signed)
Patient with pain in his left calf about a month ago. It is associated varicose veins in this area. Apparently new varicose veins in medial calf. Patient is young and doesn't have any risk factors for DVT though this is the one thing that I would be hesitant to miss. We will obtain an ultrasound of his left lower extremity to evaluate for DVT. Advised if he has a DVT we would discuss anticoagulation. If no DVT he can use warm compresses and ibuprofen if there is discomfort. Given return precautions.

## 2016-07-04 NOTE — Assessment & Plan Note (Addendum)
Overall doing well. Is overweight. Discussed diet and exercise. He will decrease soda and sweet tea intake once he is comfortable with his smoking cessation. Congratulated on smoking cessation. Flu shot given today. Up-to-date on tetanus vaccination and HIV testing. Lab work as outlined below.

## 2016-07-04 NOTE — Progress Notes (Signed)
Pre visit review using our clinic review tool, if applicable. No additional management support is needed unless otherwise documented below in the visit note. 

## 2016-07-04 NOTE — Telephone Encounter (Signed)
Pt was given results and had no questions at this time.

## 2016-07-04 NOTE — Patient Instructions (Signed)
Nice to see you. Please work on diet and exercise. You can work on cutting back on soda and sweet tea once you're comfortable with your smoking cessation. We are going to get an ultrasound of your left leg to evaluate your varicose veins. If you develop chest pain, shortness of breath, or any new or changing symptoms please seek medical attention.

## 2016-07-04 NOTE — Telephone Encounter (Signed)
Please let the patient know that his ultrasound did not show blood clot. He has varicose veins. He should continue to monitor these and if they become painful he should let us know. Thanks.

## 2016-07-04 NOTE — Telephone Encounter (Signed)
[facility] called stated that pt results from Doppler was negative.

## 2016-07-05 ENCOUNTER — Ambulatory Visit: Payer: Managed Care, Other (non HMO)

## 2016-07-05 ENCOUNTER — Telehealth: Payer: Self-pay | Admitting: Family Medicine

## 2016-07-05 NOTE — Telephone Encounter (Signed)
Pt called returning your call in regards to lab work . Thank you!  Call pt @ [phone]

## 2016-07-05 NOTE — Telephone Encounter (Signed)
Notified patient of results 

## 2017-05-10 ENCOUNTER — Ambulatory Visit (INDEPENDENT_AMBULATORY_CARE_PROVIDER_SITE_OTHER): Payer: Managed Care, Other (non HMO) | Admitting: Family Medicine

## 2017-05-10 ENCOUNTER — Encounter: Payer: Self-pay | Admitting: Family Medicine

## 2017-05-10 VITALS — BP 122/82 | HR 76 | Temp 97.6°F | Wt 192.2 lb

## 2017-05-10 DIAGNOSIS — G8929 Other chronic pain: Secondary | ICD-10-CM

## 2017-05-10 DIAGNOSIS — M545 Low back pain: Secondary | ICD-10-CM | POA: Diagnosis not present

## 2017-05-10 NOTE — Progress Notes (Signed)
  Marikay Alar, MD Phone: [phone]  Jack Phillips is a 26 y.o. male who presents today for same-day visit.  Patient notes chronic low back pain. He did get better for a period of time though over the last several months has worsened again. Notes it is bilateral low back. Notes it occasionally radiates to his hips. Notes it does okay when he is at home though when he gets in the car to ride to work for 30 minutes it starts to bother him when he gets out and is at work. Improves at home with rest to some degree. No numbness or weakness. No saddle anesthesia. No incontinence. Has taken about 600 mg of ibuprofen with some benefit. Ice has not been very beneficial. He additionally notes several weeks ago his left trapezius muscle was bothering him and he had trouble turning his head related to this. Occasionally it would shoot down his arm. That resolved and has not recurred. Patient requested neurology referral for this.  ROS see history of present illness  Objective  Physical Exam Vitals:   05/10/17 0908  BP: 122/82  Pulse: 76  Temp: 97.6 F (36.4 C)  SpO2: 98%    BP Readings from Last 3 Encounters:  05/10/17 122/82  07/04/16 114/64  12/17/15 108/68   Wt Readings from Last 3 Encounters:  05/10/17 192 lb 3.2 oz (87.2 kg)  07/04/16 188 lb 2 oz (85.3 kg)  12/17/15 190 lb (86.2 kg)    Physical Exam  Constitutional: No distress.  Cardiovascular: Normal rate, regular rhythm and normal heart sounds.   Pulmonary/Chest: Effort normal and breath sounds normal.  Musculoskeletal:  No midline spine tenderness, no midline spine step-off, no muscular back tenderness, full range of motion in neck with no discomfort  Neurological: He is alert.  5/5 strength in bilateral biceps, triceps, grip, quads, hamstrings, plantar and dorsiflexion, sensation to light touch intact in bilateral UE and LE, normal gait  Skin: He is not diaphoretic.     Assessment/Plan: Please see individual  problem list.  Chronic low back pain Chronic issue with recent exacerbation. Patient requested referral to neurology. I advised that neurology would not be the specialist to take care of back pain. Discussed options of x-ray versus physical therapy versus referral to orthopedics or sports medicine. Patient wanted referral and a referral was placed to orthopedics. Given that he will see orthopedics we will let them obtain the x-ray if they deem necessary. We will have him do exercises at home as he wanted to defer physical therapy. He can use ibuprofen 600-800 milligrams every 8 hours as needed.   Orders Placed This Encounter  Procedures  . Ambulatory referral to Orthopedic Surgery    Referral Priority:   Routine    Referral Type:   Surgical    Referral Reason:   Specialty Services Required    Requested Specialty:   Orthopedic Surgery    Number of Visits Requested:   1    Marikay Alar, MD [facility]

## 2017-05-10 NOTE — Assessment & Plan Note (Signed)
Chronic issue with recent exacerbation. Patient requested referral to neurology. I advised that neurology would not be the specialist to take care of back pain. Discussed options of x-ray versus physical therapy versus referral to orthopedics or sports medicine. Patient wanted referral and a referral was placed to orthopedics. Given that he will see orthopedics we will let them obtain the x-ray if they deem necessary. We will have him do exercises at home as he wanted to defer physical therapy. He can use ibuprofen 600-800 milligrams every 8 hours as needed.

## 2017-05-10 NOTE — Patient Instructions (Addendum)
Nice to see you. You can continue ibuprofen 600-800 mg every 8 hours as needed for discomfort. You should try using heat as well. Please do the following exercises. We'll refer you to orthopedics for evaluation.   Back Exercises The following exercises strengthen the muscles that help to support the back. They also help to keep the lower back flexible. Doing these exercises can help to prevent back pain or lessen existing pain. If you have back pain or discomfort, try doing these exercises 2-3 times each day or as told by your health care provider. When the pain goes away, do them once each day, but increase the number of times that you repeat the steps for each exercise (do more repetitions). If you do not have back pain or discomfort, do these exercises once each day or as told by your health care provider. Exercises Single Knee to Chest  Repeat these steps 3-5 times for each leg: 1. Lie on your back on a firm bed or the floor with your legs extended. 2. Bring one knee to your chest. Your other leg should stay extended and in contact with the floor. 3. Hold your knee in place by grabbing your knee or thigh. 4. Pull on your knee until you feel a gentle stretch in your lower back. 5. Hold the stretch for 10-30 seconds. 6. Slowly release and straighten your leg.  Pelvic Tilt  Repeat these steps 5-10 times: 1. Lie on your back on a firm bed or the floor with your legs extended. 2. Bend your knees so they are pointing toward the ceiling and your feet are flat on the floor. 3. Tighten your lower abdominal muscles to press your lower back against the floor. This motion will tilt your pelvis so your tailbone points up toward the ceiling instead of pointing to your feet or the floor. 4. With gentle tension and even breathing, hold this position for 5-10 seconds.  Cat-Cow  Repeat these steps until your lower back becomes more flexible: 1. Get into a hands-and-knees position on a firm surface.  Keep your hands under your shoulders, and keep your knees under your hips. You may place padding under your knees for comfort. 2. Let your head hang down, and point your tailbone toward the floor so your lower back becomes rounded like the back of a cat. 3. Hold this position for 5 seconds. 4. Slowly lift your head and point your tailbone up toward the ceiling so your back forms a sagging arch like the back of a cow. 5. Hold this position for 5 seconds.  Press-Ups  Repeat these steps 5-10 times: 1. Lie on your abdomen (face-down) on the floor. 2. Place your palms near your head, about shoulder-width apart. 3. While you keep your back as relaxed as possible and keep your hips on the floor, slowly straighten your arms to raise the top half of your body and lift your shoulders. Do not use your back muscles to raise your upper torso. You may adjust the placement of your hands to make yourself more comfortable. 4. Hold this position for 5 seconds while you keep your back relaxed. 5. Slowly return to lying flat on the floor.  Bridges  Repeat these steps 10 times: 1. Lie on your back on a firm surface. 2. Bend your knees so they are pointing toward the ceiling and your feet are flat on the floor. 3. Tighten your buttocks muscles and lift your buttocks off of the floor until your waist  is at almost the same height as your knees. You should feel the muscles working in your buttocks and the back of your thighs. If you do not feel these muscles, slide your feet 1-2 inches farther away from your buttocks. 4. Hold this position for 3-5 seconds. 5. Slowly lower your hips to the starting position, and allow your buttocks muscles to relax completely.  If this exercise is too easy, try doing it with your arms crossed over your chest. Abdominal Crunches  Repeat these steps 5-10 times: 1. Lie on your back on a firm bed or the floor with your legs extended. 2. Bend your knees so they are pointing toward the  ceiling and your feet are flat on the floor. 3. Cross your arms over your chest. 4. Tip your chin slightly toward your chest without bending your neck. 5. Tighten your abdominal muscles and slowly raise your trunk (torso) high enough to lift your shoulder blades a tiny bit off of the floor. Avoid raising your torso higher than that, because it can put too much stress on your low back and it does not help to strengthen your abdominal muscles. 6. Slowly return to your starting position.  Back Lifts Repeat these steps 5-10 times: 1. Lie on your abdomen (face-down) with your arms at your sides, and rest your forehead on the floor. 2. Tighten the muscles in your legs and your buttocks. 3. Slowly lift your chest off of the floor while you keep your hips pressed to the floor. Keep the back of your head in line with the curve in your back. Your eyes should be looking at the floor. 4. Hold this position for 3-5 seconds. 5. Slowly return to your starting position.  Contact a health care provider if:  Your back pain or discomfort gets much worse when you do an exercise.  Your back pain or discomfort does not lessen within 2 hours after you exercise. If you have any of these problems, stop doing these exercises right away. Do not do them again unless your health care provider says that you can. Get help right away if:  You develop sudden, severe back pain. If this happens, stop doing the exercises right away. Do not do them again unless your health care provider says that you can. This information is not intended to replace advice given to you by your health care provider. Make sure you discuss any questions you have with your health care provider. Document Released: 09/15/2004 Document Revised: 12/16/2015 Document Reviewed: 10/02/2014 Elsevier Interactive Patient Education  2017 [employer].

## 2017-05-12 ENCOUNTER — Telehealth: Payer: Self-pay | Admitting: *Deleted

## 2017-05-12 NOTE — Telephone Encounter (Signed)
Pt was advised to call the office if had not heard anything back about his referral for orthopedic  Pt contact [phone]

## 2017-10-20 IMAGING — US US EXTREM LOW VENOUS*L*
1 series · 14 of 24 positions shown · non-contrast
Comparison: None.

CLINICAL DATA: Left lower extremity pain

EXAM:
LEFT LOWER EXTREMITY VENOUS DUPLEX ULTRASOUND
TECHNIQUE: Doppler venous assessment of the left lower extremity deep venous
system was performed, including characterization of spectral flow,
compressibility, and phasicity.

[Series 1: us extrem low venous*left* · 0.07mm/px · 14 of 37 slices shown]
[im 1/37]
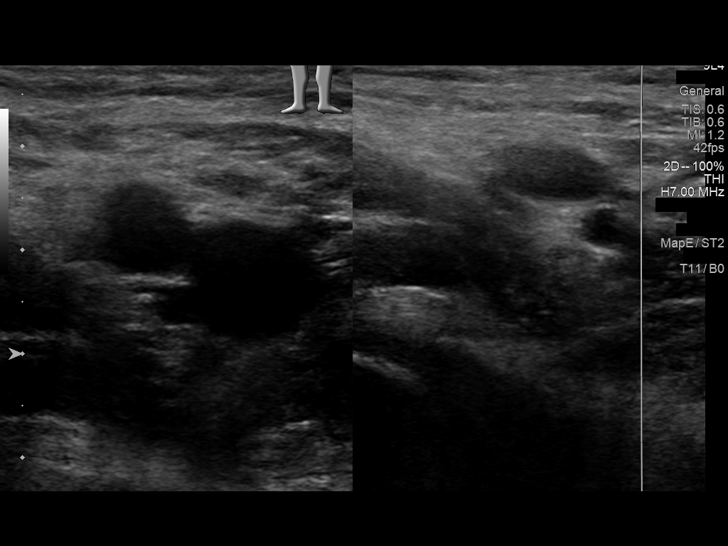
[im 4/37]
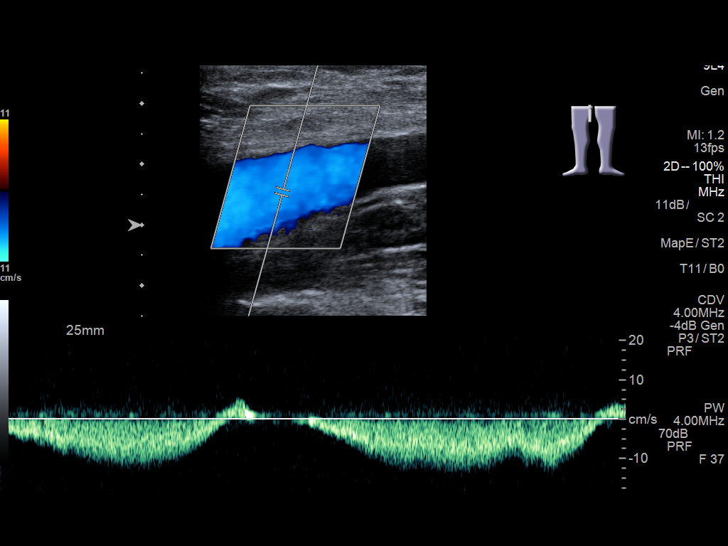
[im 7/37]
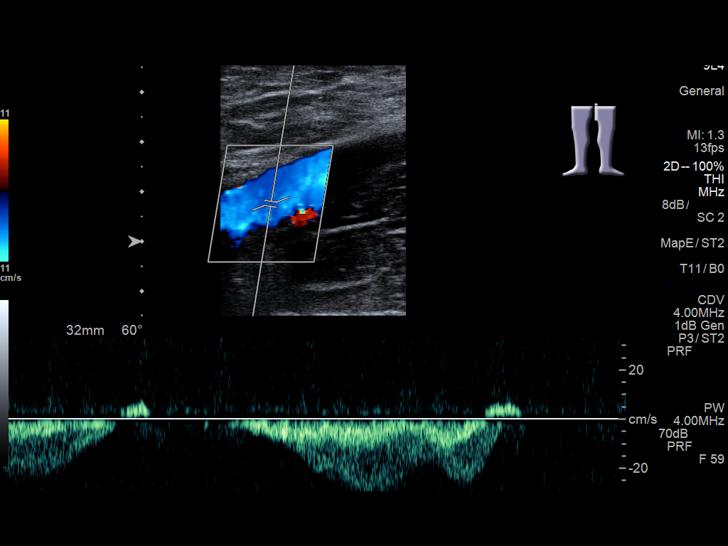
[im 10/37]
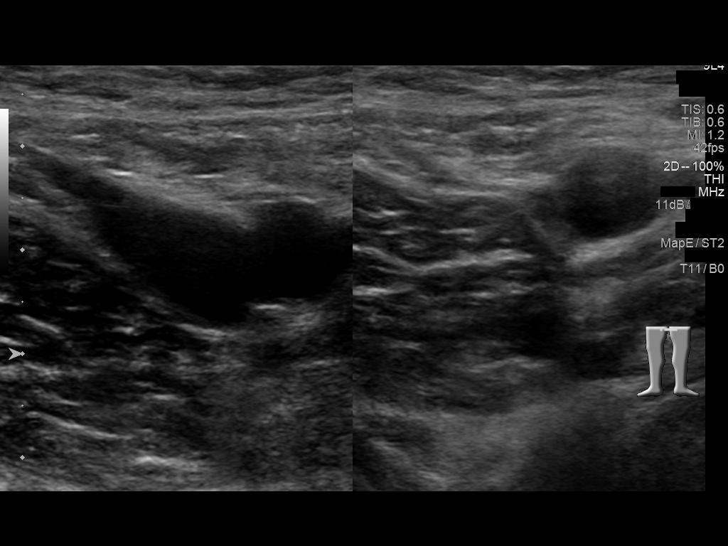
[im 11/37]
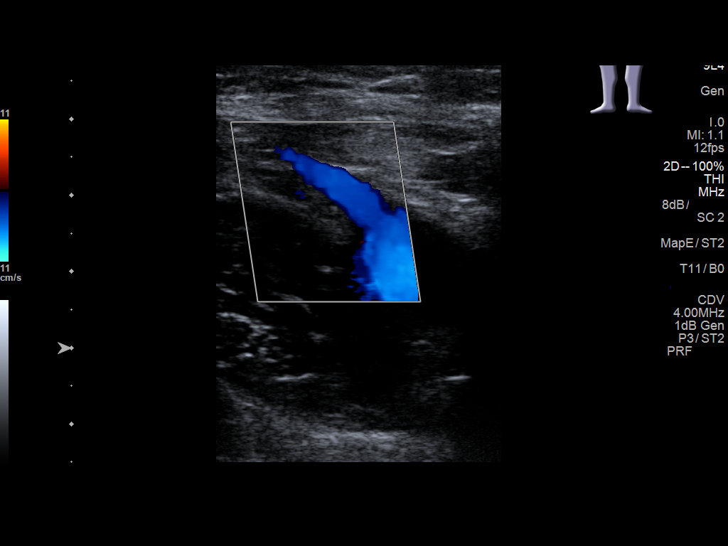
[im 15/37]
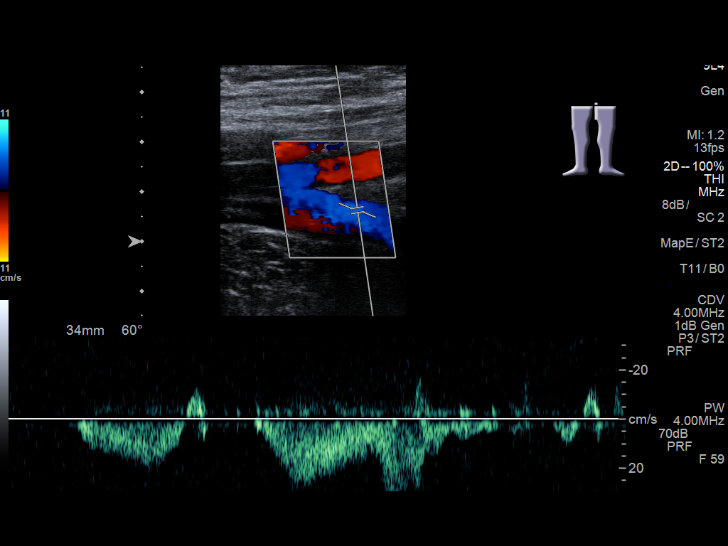
[im 18/37]
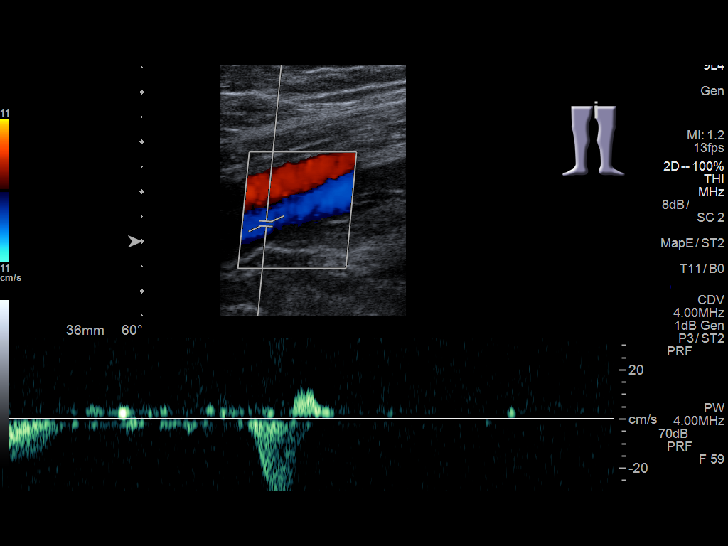
[im 19/37]
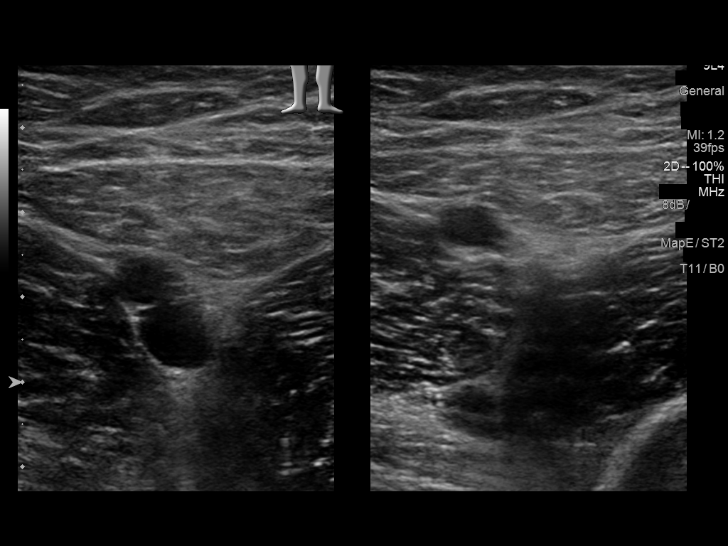
[im 22/37]
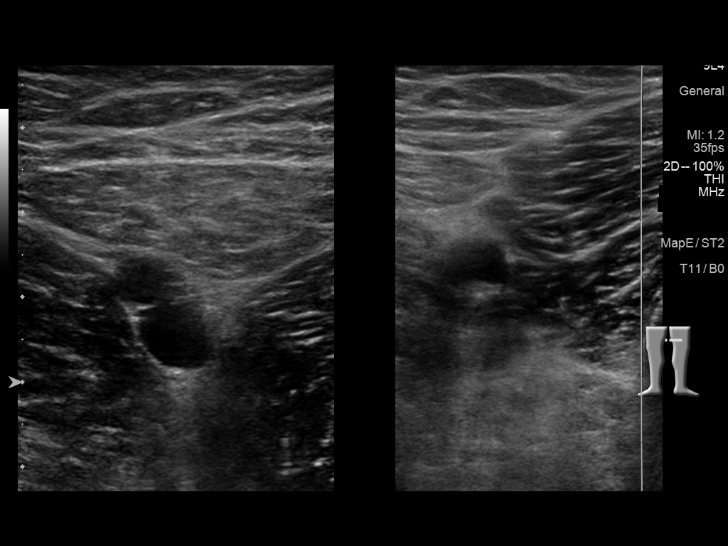
[im 26/37]
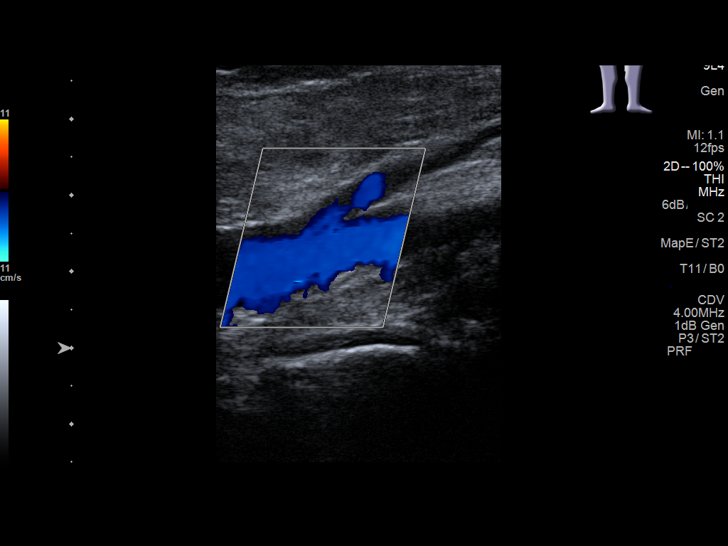
[im 29/37]
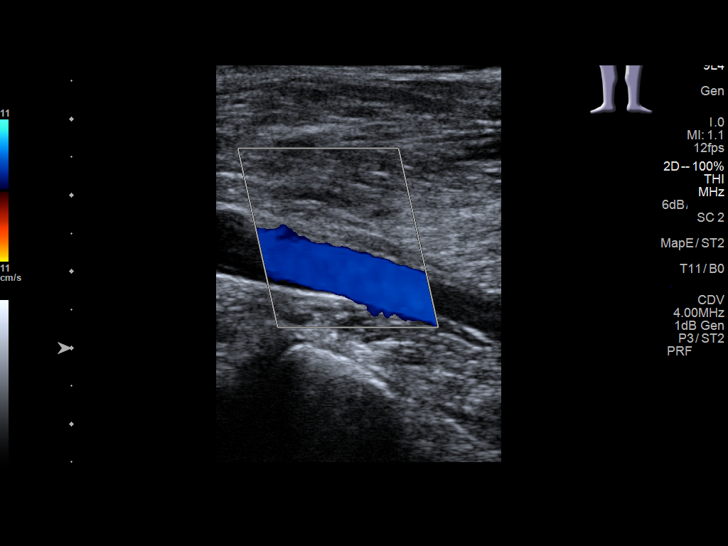
[im 30/37]
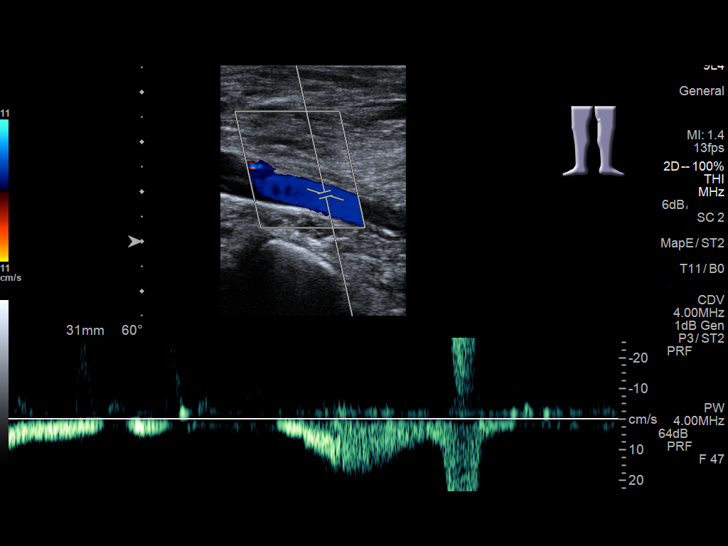
[im 33/37]
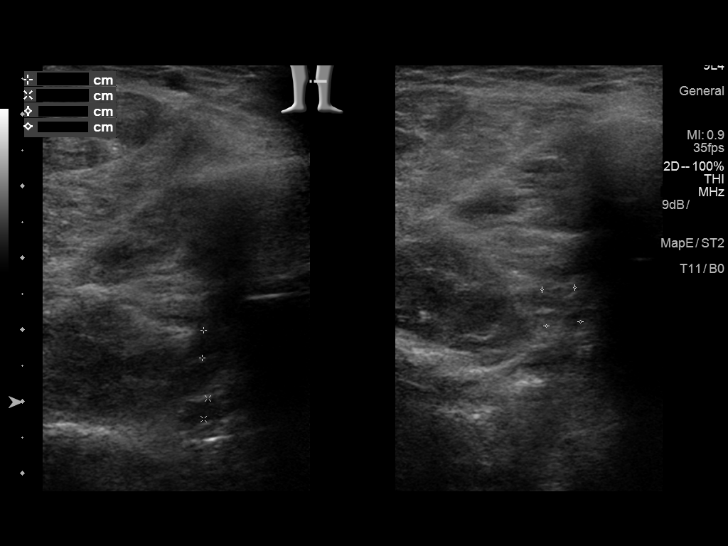
[im 37/37]
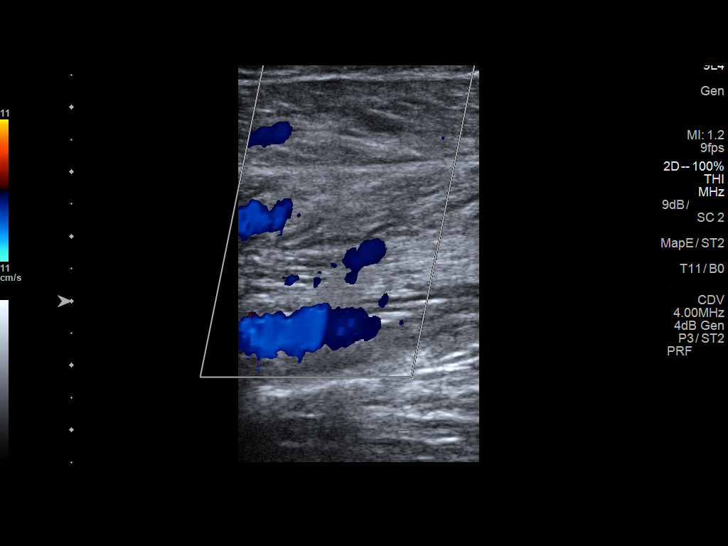

[14 of 24 positions shown; findings below may reference images not displayed]

FINDINGS: There is complete compressibility of the left common femoral,
femoral, and popliteal veins. Doppler analysis demonstrates
respiratory phasicity and augmentation of flow with calf
compression. No obvious superficial vein or calf vein thrombosis.
IMPRESSION: No evidence of left lower extremity DVT.
